# Patient Record
Sex: Male | Born: 1980 | Race: White | Hispanic: No | Marital: Married | Smoking: Never smoker
Health system: Southern US, Community
[De-identification: ages and names within clinical notes are randomized; demographics above are authoritative.]

## PROBLEM LIST (undated history)

## (undated) DIAGNOSIS — G4700 Insomnia, unspecified: Secondary | ICD-10-CM

## (undated) DIAGNOSIS — IMO0002 Reserved for concepts with insufficient information to code with codable children: Secondary | ICD-10-CM

## (undated) HISTORY — PX: FRACTURE SURGERY: SHX138

## (undated) HISTORY — PX: CHOLECYSTECTOMY: SHX55

---

## 2012-12-21 ENCOUNTER — Emergency Department (HOSPITAL_COMMUNITY)
Admission: EM | Admit: 2012-12-21 | Discharge: 2012-12-21 | Disposition: A | Discharge status: Home / Self Care | Payer: Managed Care, Other (non HMO) | Attending: Emergency Medicine | Admitting: Emergency Medicine

## 2012-12-21 ENCOUNTER — Emergency Department (HOSPITAL_COMMUNITY): Payer: Managed Care, Other (non HMO)

## 2012-12-21 ENCOUNTER — Encounter (HOSPITAL_COMMUNITY): Payer: Self-pay | Admitting: *Deleted

## 2012-12-21 DIAGNOSIS — Y929 Unspecified place or not applicable: Secondary | ICD-10-CM | POA: Insufficient documentation

## 2012-12-21 DIAGNOSIS — X500XXA Overexertion from strenuous movement or load, initial encounter: Secondary | ICD-10-CM | POA: Insufficient documentation

## 2012-12-21 DIAGNOSIS — G4700 Insomnia, unspecified: Secondary | ICD-10-CM | POA: Insufficient documentation

## 2012-12-21 DIAGNOSIS — Y9389 Activity, other specified: Secondary | ICD-10-CM | POA: Insufficient documentation

## 2012-12-21 DIAGNOSIS — S63502A Unspecified sprain of left wrist, initial encounter: Secondary | ICD-10-CM

## 2012-12-21 DIAGNOSIS — R0982 Postnasal drip: Secondary | ICD-10-CM | POA: Insufficient documentation

## 2012-12-21 DIAGNOSIS — R05 Cough: Secondary | ICD-10-CM | POA: Insufficient documentation

## 2012-12-21 DIAGNOSIS — S63509A Unspecified sprain of unspecified wrist, initial encounter: Secondary | ICD-10-CM | POA: Insufficient documentation

## 2012-12-21 DIAGNOSIS — R059 Cough, unspecified: Secondary | ICD-10-CM | POA: Insufficient documentation

## 2012-12-21 DIAGNOSIS — J3489 Other specified disorders of nose and nasal sinuses: Secondary | ICD-10-CM | POA: Insufficient documentation

## 2012-12-21 DIAGNOSIS — Z79899 Other long term (current) drug therapy: Secondary | ICD-10-CM | POA: Insufficient documentation

## 2012-12-21 DIAGNOSIS — Z8739 Personal history of other diseases of the musculoskeletal system and connective tissue: Secondary | ICD-10-CM | POA: Insufficient documentation

## 2012-12-21 HISTORY — DX: Reserved for concepts with insufficient information to code with codable children: IMO0002

## 2012-12-21 HISTORY — DX: Insomnia, unspecified: G47.00

## 2012-12-21 MED ORDER — IBUPROFEN 800 MG PO TABS: 800.0000 mg | ORAL_TABLET | Freq: Three times a day (TID) | ORAL | Status: DC

## 2012-12-21 MED ORDER — HYDROCODONE-ACETAMINOPHEN 5-325 MG PO TABS
1.0000 | ORAL_TABLET | Freq: Once | ORAL | Status: AC
  Administered 2012-12-21: 1 via ORAL
  Filled 2012-12-21: qty 1

## 2012-12-21 MED ORDER — BENZONATATE 100 MG PO CAPS: 200.0000 mg | ORAL_CAPSULE | Freq: Three times a day (TID) | ORAL | Status: DC | PRN

## 2012-12-21 MED ORDER — HYDROCODONE-ACETAMINOPHEN 5-325 MG PO TABS
ORAL_TABLET | ORAL | Status: DC
Start: 2012-12-21 — End: 2014-11-17

## 2012-12-21 MED ORDER — IBUPROFEN 800 MG PO TABS
800.0000 mg | ORAL_TABLET | Freq: Once | ORAL | Status: AC
  Administered 2012-12-21: 800 mg via ORAL
  Filled 2012-12-21: qty 1

## 2012-12-21 NOTE — ED Notes (Signed)
Pt moved a box and states his left wrist snapped. Pt also states he has a dry cough.

## 2012-12-21 NOTE — ED Provider Notes (Signed)
CSN: [id number]     Arrival date & time 12/21/12  1942 History   First MD Initiated Contact with Patient 12/21/12 1953     Chief Complaint  Patient presents with  . Wrist Pain   (Consider location/radiation/quality/duration/timing/severity/associated sxs/prior Treatment) HPI Comments: Jordan Molina is a 32 y.o. male who presents to the Emergency Department complaining of left wrist pain or can earlier today while picking up boxes. States he felt his left wrist" pop". He states he has pain with movement of his wrist that radiates up his left forearm and into his left fifth finger. He denies swelling or numbness. Patient also complains of a dry cough for several days. He does also report some nasal congestion. He denies fever, shortness of breath, chest pain, sore throat or other URI symptoms.  Patient is a 32 y.o. male presenting with wrist pain. The history is provided by the patient.  Wrist Pain This is a new problem. The current episode started today. The problem occurs constantly. The problem has been unchanged. Associated symptoms include arthralgias, congestion and coughing. Pertinent negatives include no abdominal pain, chest pain, chills, diaphoresis, fever, headaches, joint swelling, nausea, neck pain, numbness, rash, sore throat, swollen glands or weakness. The symptoms are aggravated by bending. He has tried nothing for the symptoms. The treatment provided no relief.    Past Medical History  Diagnosis Date  . Insomnia   . Degenerative disc disease    Past Surgical History  Procedure Laterality Date  . Cholecystectomy    . Fracture surgery      facial   History reviewed. No pertinent family history. History  Substance Use Topics  . Smoking status: Never Smoker   . Smokeless tobacco: Not on file  . Alcohol Use: No    Review of Systems  Constitutional: Negative for fever, chills and diaphoresis.  HENT: Positive for congestion and postnasal drip. Negative for sore throat,  sneezing, trouble swallowing and neck pain.   Respiratory: Positive for cough. Negative for chest tightness.   Cardiovascular: Negative for chest pain.  Gastrointestinal: Negative for nausea and abdominal pain.  Genitourinary: Negative for dysuria and difficulty urinating.  Musculoskeletal: Positive for arthralgias. Negative for joint swelling.  Skin: Negative for color change, rash and wound.  Neurological: Negative for dizziness, weakness, numbness and headaches.  All other systems reviewed and are negative.    Allergies  Review of patient's allergies indicates no known allergies.  Home Medications   Current Outpatient Rx  Name  Route  Sig  Dispense  Refill  . ALPRAZolam (XANAX) 1 MG tablet   Oral   Take 1 mg by mouth 3 (three) times daily.         . carisoprodol (SOMA) 350 MG tablet   Oral   Take 350 mg by mouth 3 (three) times daily as needed for muscle spasms.         Marland Kitchen lidocaine (LIDODERM) 5 %   Transdermal   Place 1 patch onto the skin daily. Remove & Discard patch within 12 hours or as directed by MD         . traMADol (ULTRAM) 50 MG tablet   Oral   Take 50 mg by mouth every 6 (six) hours as needed for pain.         Marland Kitchen zolpidem (AMBIEN) 10 MG tablet   Oral   Take 10 mg by mouth at bedtime as needed for sleep.          BP 124/69  Pulse  90  Temp(Src) 97.8 F (36.6 C) (Oral)  Resp 20  Ht 5\' 11"  (1.803 m)  Wt 135 lb (61.236 kg)  BMI 18.84 kg/m2  SpO2 100% Physical Exam  Nursing note and vitals reviewed. Constitutional: He is oriented to person, place, and time. He appears well-developed and well-nourished. No distress.  HENT:  Head: Normocephalic and atraumatic.  Mouth/Throat: Oropharynx is clear and moist.  Neck: Normal range of motion. Neck supple.  Cardiovascular: Normal rate, regular rhythm, normal heart sounds and intact distal pulses.   No murmur heard. Pulmonary/Chest: Effort normal and breath sounds normal. No respiratory distress. He has  no wheezes. He has no rales. He exhibits no tenderness.  Musculoskeletal: He exhibits tenderness. He exhibits no edema.       Left wrist: He exhibits tenderness. He exhibits normal range of motion, no bony tenderness, no swelling, no effusion, no crepitus, no deformity and no laceration.       Arms: Localized tenderness to the ulnar aspect of the left wrist.  Radial pulse is brisk, distal sensation intact.  CR< 2 sec.  No bruising , edema or bony deformity.  Pain to left wrist is reproduced with palmar or dorsal flexion. Compartments soft.  Lymphadenopathy:    He has no cervical adenopathy.  Neurological: He is alert and oriented to person, place, and time. He exhibits normal muscle tone. Coordination normal.  Skin: Skin is warm and dry.    ED Course  Procedures (including critical care time) Labs Review Labs Reviewed - No data to display Imaging Review Dg Wrist Complete Left  12/21/2012   *RADIOLOGY REPORT*  Clinical Data: Pain and limited range of motion  LEFT WRIST - COMPLETE 3+ VIEW  Comparison: None.  Findings: Frontal, oblique, lateral, and ulnar deviation scaphoid images were obtained.  There is no fracture or dislocation. Joint spaces appear intact.  No erosive change.  IMPRESSION: No abnormality noted.   Original Report Authenticated By: Bretta Bang, M.D.    MDM    X-ray results reviewed and discussed with the patient. He has tenderness to palpation along the ulnar aspect of the left wrist. Pain is reproduced with dorsal or palmar flexion. No erythema, edema of the wrist or forearm. Likely sprain.  Velcro wrist splint applied, pain improved, remains neurovascularly intact.  Patient agrees to RICE therapy and close orthopedic followup if his symptoms aren't improving. Vital signs are stable he appears stable for discharge   Wallie Lagrand L. Trisha Mangle, PA-C 12/21/12 2058

## 2012-12-22 NOTE — ED Provider Notes (Signed)
Medical screening examination/treatment/procedure(s) were performed by non-physician practitioner and as supervising physician I was immediately available for consultation/collaboration.    Christopher J. Pollina, MD 12/22/12 1521 

## 2014-11-17 ENCOUNTER — Emergency Department (HOSPITAL_COMMUNITY): Payer: Managed Care, Other (non HMO)

## 2014-11-17 ENCOUNTER — Emergency Department (HOSPITAL_COMMUNITY)
Admission: EM | Admit: 2014-11-17 | Discharge: 2014-11-17 | Disposition: A | Discharge status: Home / Self Care | Payer: Managed Care, Other (non HMO) | Attending: Emergency Medicine | Admitting: Emergency Medicine

## 2014-11-17 ENCOUNTER — Encounter (HOSPITAL_COMMUNITY): Payer: Self-pay | Admitting: Emergency Medicine

## 2014-11-17 DIAGNOSIS — Y998 Other external cause status: Secondary | ICD-10-CM | POA: Insufficient documentation

## 2014-11-17 DIAGNOSIS — S00402A Unspecified superficial injury of left ear, initial encounter: Secondary | ICD-10-CM | POA: Insufficient documentation

## 2014-11-17 DIAGNOSIS — S060X0A Concussion without loss of consciousness, initial encounter: Secondary | ICD-10-CM | POA: Insufficient documentation

## 2014-11-17 DIAGNOSIS — S0993XA Unspecified injury of face, initial encounter: Secondary | ICD-10-CM | POA: Diagnosis not present

## 2014-11-17 DIAGNOSIS — Y9389 Activity, other specified: Secondary | ICD-10-CM | POA: Diagnosis not present

## 2014-11-17 DIAGNOSIS — Z8669 Personal history of other diseases of the nervous system and sense organs: Secondary | ICD-10-CM | POA: Insufficient documentation

## 2014-11-17 DIAGNOSIS — Y9289 Other specified places as the place of occurrence of the external cause: Secondary | ICD-10-CM | POA: Insufficient documentation

## 2014-11-17 DIAGNOSIS — F0781 Postconcussional syndrome: Secondary | ICD-10-CM

## 2014-11-17 DIAGNOSIS — W228XXA Striking against or struck by other objects, initial encounter: Secondary | ICD-10-CM | POA: Diagnosis not present

## 2014-11-17 DIAGNOSIS — M26629 Arthralgia of temporomandibular joint, unspecified side: Secondary | ICD-10-CM

## 2014-11-17 MED ORDER — HYDROCODONE-ACETAMINOPHEN 5-325 MG PO TABS: 1.0000 | ORAL_TABLET | ORAL | Status: AC | PRN

## 2014-11-17 MED ORDER — KETOROLAC TROMETHAMINE 60 MG/2ML IM SOLN
60.0000 mg | Freq: Once | INTRAMUSCULAR | Status: AC
  Administered 2014-11-17: 60 mg via INTRAMUSCULAR
  Filled 2014-11-17: qty 2

## 2014-11-17 MED ORDER — IBUPROFEN 600 MG PO TABS: 600.0000 mg | ORAL_TABLET | Freq: Four times a day (QID) | ORAL | Status: AC | PRN

## 2014-11-17 MED ORDER — HYDROCODONE-ACETAMINOPHEN 5-325 MG PO TABS
1.0000 | ORAL_TABLET | Freq: Once | ORAL | Status: AC
  Administered 2014-11-17: 1 via ORAL
  Filled 2014-11-17: qty 1

## 2014-11-17 NOTE — ED Notes (Signed)
Pt given discharge instructions- Verbalized understanding , Also discussed other non medication pain interventions - Stated that he was instructed by ER provider to fu with ENT md by end of next week-  Abulated off unit - Work note provided

## 2014-11-17 NOTE — ED Provider Notes (Signed)
CSN: [id number]     Arrival date & time 11/17/14  1538 History   First MD Initiated Contact with Patient 11/17/14 1615     Chief Complaint  Patient presents with  . Head Injury    2 days ago     (Consider location/radiation/quality/duration/timing/severity/associated sxs/prior Treatment) The history is provided by the patient.   Jordan Molina is a 34 y.o. male with pain along his left temple, ear and jawline since he sustained a blow to the head 2 days ago.  He describes going down a flight of steps which has a narrow wooden overhand and he did not duck in time, hitting his left head against the edge of a wooden piece of plywood.  He denies loc at the time, but endorses persistent generalized headache along with decreased ability to concentrate and has been sleeping more since the injury occurred.  He also reports left ear pain and a "popping" sensation with pain in his left jaw with movement.  He feels like his jaw will lock if opens too far but has not.  He denies any dental injury and with his mouth closed, his bite feels normal.  He denies weakness, numbness, nausea or vomiting.  He has taken ibuprofen without relief and has applied ice packs.    Past Medical History  Diagnosis Date  . Insomnia   . Degenerative disc disease    Past Surgical History  Procedure Laterality Date  . Cholecystectomy    . Fracture surgery      facial   History reviewed. No pertinent family history. History  Substance Use Topics  . Smoking status: Never Smoker   . Smokeless tobacco: Not on file  . Alcohol Use: No    Review of Systems  Constitutional: Negative for fever.  HENT: Negative for congestion, dental problem, facial swelling, sore throat, tinnitus and trouble swallowing.   Eyes: Negative.  Negative for visual disturbance.  Respiratory: Negative for chest tightness and shortness of breath.   Cardiovascular: Negative for chest pain.  Gastrointestinal: Negative for nausea and abdominal  pain.  Genitourinary: Negative.   Musculoskeletal: Negative for joint swelling, arthralgias and neck stiffness.  Skin: Negative.  Negative for rash and wound.  Neurological: Positive for headaches. Negative for dizziness, weakness, light-headedness and numbness.  Psychiatric/Behavioral: Negative.       Allergies  Review of patient's allergies indicates no known allergies.  Home Medications   Prior to Admission medications   Medication Sig Start Date End Date Taking? Authorizing Provider  HYDROcodone-acetaminophen (NORCO/VICODIN) 5-325 MG per tablet Take 1 tablet by mouth every 4 (four) hours as needed. 11/17/14   Burgess Amor, PA-C  ibuprofen (ADVIL,MOTRIN) 600 MG tablet Take 1 tablet (600 mg total) by mouth every 6 (six) hours as needed. 11/17/14   Burgess Amor, PA-C   BP 129/75 mmHg  Pulse 71  Resp 15  Ht  (1.803 m)  Wt 140 lb (63.504 kg)  BMI 19.53 kg/m2  SpO2 100% Physical Exam  Constitutional: He is oriented to person, place, and time. He appears well-developed and well-nourished. No distress.  HENT:  Head: Normocephalic and atraumatic. Head is without raccoon's eyes, without Battle's sign and without contusion.  Right Ear: External ear and ear canal normal.  Left Ear: Ear canal normal. There is mastoid tenderness.  Nose: Nose normal.  Mouth/Throat: Uvula is midline. No trismus in the jaw.  No visual signs of facial or head trauma.  He has ttp left temple and left mastoid, left TM joint.  There is moderate crepitus with extension of the TM joint.  No palpable deformity, no edema.  Dentition intact.    Eyes: Conjunctivae are normal.  Neck: Normal range of motion. Spinous process tenderness and muscular tenderness present.  Cardiovascular: Normal rate, regular rhythm, normal heart sounds and intact distal pulses.   Pulmonary/Chest: Effort normal and breath sounds normal. He has no wheezes.  Abdominal: Soft. Bowel sounds are normal. There is no tenderness.  Musculoskeletal:  Normal range of motion.  Neurological: He is alert and oriented to person, place, and time. He has normal strength. No cranial nerve deficit or sensory deficit. He exhibits normal muscle tone. Gait normal. GCS eye subscore is 4. GCS verbal subscore is 5. GCS motor subscore is 6.  Skin: Skin is warm and dry.  Psychiatric: He has a normal mood and affect.  Nursing note and vitals reviewed.   ED Course  Procedures (including critical care time) Labs Review Labs Reviewed - No data to display  Imaging Review Dg Cervical Spine Complete  11/17/2014   CLINICAL DATA:  Head trauma, pt hit left side of temple area on metal awning at door  EXAM: CERVICAL SPINE  4+ VIEWS  COMPARISON:  None.  FINDINGS: No fracture. No spondylolisthesis. Disc spaces are well maintained. No significant neural foraminal. Changes from the operative reduction mandibular fractures, now well healed.  Soft tissues are unremarkable.  IMPRESSION: No fracture or spondylolisthesis. Normal appearance of cervical spine.   Electronically Signed   By: Amie Portland M.D.   On: 11/17/2014 18:08   Ct Head Wo Contrast  11/17/2014   CLINICAL DATA:  Patient hit head falling down the stairs. Headache. Neck discomfort. Jaw pain. Initial encounter.  EXAM: CT HEAD WITHOUT CONTRAST  CT MAXILLOFACIAL WITHOUT CONTRAST  TECHNIQUE: Multidetector CT imaging of the head and maxillofacial structures were performed using the standard protocol without intravenous contrast. Multiplanar CT image reconstructions of the maxillofacial structures were also generated.  COMPARISON:  None.  FINDINGS: CT HEAD FINDINGS  Ventricles and sulci are appropriate for patient's age. No evidence for acute cortically based infarct, intracranial hemorrhage, mass lesion or mass-effect. Polypoid mucosal thickening right maxillary sinus. Calvarium is intact.  CT MAXILLOFACIAL FINDINGS  Orbits are unremarkable. Facial soft tissues are grossly unremarkable. Zygomatic arches are intact.  Pterygoid plates are intact. Postsurgical changes to the maxilla. Maxilla is intact. Mandible is intact. Nasal bones intact. Orbital walls are intact. TMJ joint degenerative changes bilaterally.  IMPRESSION: No acute intracranial process.  No acute maxillofacial fracture.  Bilateral TMJ joint degenerative change.   Electronically Signed   By: Annia Belt M.D.   On: 11/17/2014 18:34   Ct Maxillofacial Wo Cm  11/17/2014   CLINICAL DATA:  Patient hit head falling down the stairs. Headache. Neck discomfort. Jaw pain. Initial encounter.  EXAM: CT HEAD WITHOUT CONTRAST  CT MAXILLOFACIAL WITHOUT CONTRAST  TECHNIQUE: Multidetector CT imaging of the head and maxillofacial structures were performed using the standard protocol without intravenous contrast. Multiplanar CT image reconstructions of the maxillofacial structures were also generated.  COMPARISON:  None.  FINDINGS: CT HEAD FINDINGS  Ventricles and sulci are appropriate for patient's age. No evidence for acute cortically based infarct, intracranial hemorrhage, mass lesion or mass-effect. Polypoid mucosal thickening right maxillary sinus. Calvarium is intact.  CT MAXILLOFACIAL FINDINGS  Orbits are unremarkable. Facial soft tissues are grossly unremarkable. Zygomatic arches are intact. Pterygoid plates are intact. Postsurgical changes to the maxilla. Maxilla is intact. Mandible is intact. Nasal bones intact. Orbital walls  are intact. TMJ joint degenerative changes bilaterally.  IMPRESSION: No acute intracranial process.  No acute maxillofacial fracture.  Bilateral TMJ joint degenerative change.   Electronically Signed   By: Annia Belt M.D.   On: 11/17/2014 18:34     EKG Interpretation None      MDM   Final diagnoses:  Post concussion syndrome  Temporomandibular joint (TMJ) pain    Patients labs and/or radiological studies were reviewed and considered during the medical decision making and disposition process.   Imaging was reviewed, interpreted and I  agree with radiologists reading.  Results were also discussed with patient.  Pt was given post concussion instructions as I suspect he does have mild lingering concussion sx.  He was given ibuprofen, small quantity of hydrocodone, cautioned re sedation.  Referral to Dr Suszanne Conners for recheck if sx persist beyond the next week.  Ice tx through tomorrow, may add heat on Monday.  Soft food diet until sx improved.  The patient appears reasonably screened and/or stabilized for discharge and I doubt any other medical condition or other [facility] requiring further screening, evaluation, or treatment in the ED at this time prior to discharge.     Burgess Amor, PA-C 11/17/14 1941  Benjiman Core, MD 11/17/14 [id number]

## 2014-11-17 NOTE — Discharge Instructions (Signed)
Temporomandibular Problems  Temporomandibular joint (TMJ) dysfunction means there are problems with the joint between your jaw and your skull. This is a joint lined by cartilage like other joints in your body but also has a small disc in the joint which keeps the bones from rubbing on each other. These joints are like other joints and can get inflamed (sore) from arthritis and other problems. When this joint gets sore, it can cause headaches and pain in the jaw and the face. CAUSES  Usually the arthritic types of problems are caused by soreness in the joint. Soreness in the joint can also be caused by overuse. This may come from grinding your teeth. It may also come from mis-alignment in the joint. DIAGNOSIS Diagnosis of this condition can often be made by history and exam. Sometimes your caregiver may need X-rays or an MRI scan to determine the exact cause. It may be necessary to see your dentist to determine if your teeth and jaws are lined up correctly. TREATMENT  Most of the time this problem is not serious; however, sometimes it can persist (become chronic). When this happens medications that will cut down on inflammation (soreness) help. Sometimes a shot of cortisone into the joint will be helpful. If your teeth are not aligned it may help for your dentist to make a splint for your mouth that can help this problem. If no physical problems can be found, the problem may come from tension. If tension is found to be the cause, biofeedback or relaxation techniques may be helpful. HOME CARE INSTRUCTIONS   Later in the day, applications of ice packs may be helpful. Ice can be used in a plastic bag with a towel around it to prevent frostbite to skin. This may be used about every 2 hours for 20 to 30 minutes, as needed while awake, or as directed by your caregiver.  Only take over-the-counter or prescription medicines for pain, discomfort, or fever as directed by your caregiver.  If physical therapy was  prescribed, follow your caregiver's directions.  Wear mouth appliances as directed if they were given. Document Released: 12/23/2000 Document Revised: 06/22/2011 Document Reviewed: 04/01/2008 [facility] Patient Information 2015 [place], [place]. This information is not intended to replace advice given to you by your health care provider. Make sure you discuss any questions you have with your health care provider.  Post-Concussion Syndrome Post-concussion syndrome means you have problems after a head injury. The problems can last for weeks or months. The problems usually go away on their own over time. HOME CARE   Only take medicines as told by your doctor. Do not take aspirin.  Sleep with your head raised (elevated) to help with headaches.  Avoid activities that can cause another head injury.  Do not play contact sports like football, hockey, soccer or basketball. Do not do other risky activities like downhill skiing or martial arts, or ride horses until your doctor says it is OK.  Keep all doctor visits as told. GET HELP RIGHT AWAY IF:  You feel confused or very sleepy.  You cannot wake the injured person.  You feel sick to your stomach (nauseous) or keep throwing up (vomiting).  You feel like you are moving when you are not (vertigo).  You notice the injured person's eyes moving back and forth very fast.  You start shaking (convulsing) or pass out (faint).  You have very bad headaches that do not get better with medicine.  You cannot use your arms or legs normally.  The black centers of your eyes (pupils) change size.  You have clear or bloody fluid coming from your nose or ears.  Your problems get worse, not better. MAKE SURE YOU:  Understand these instructions.  Will watch your condition.  Will get help right away if you are not doing well or get worse. Document Released: 05/07/2004 Document Revised: 01/18/2013 Document Reviewed: 07/05/2013 [facility] Patient  Information 2015 [place], [place]. This information is not intended to replace advice given to you by your health care provider. Make sure you discuss any questions you have with your health care provider.   Your imaging studies today are negative for acute injury.  However I do suspect she may have a mild concussion as discussed.  Refer to the instructions above for treatment of this condition.  I suggest a follow-up with Dr. Suszanne Conners if your jaw pain persists beyond the next week.  Take medications as prescribed, do not drive within 4 hours can hydrocodone as this medication may make you drowsy.  You may continue use ice to your area of injury through tomorrow, I would add a heating pad 20 minutes several times daily starting on Monday.

## 2014-11-17 NOTE — ED Notes (Signed)
Hit head 2 days ago going down steps on plywood.  C/o headache, neck discomfort and jaw pain.  Jaw is making noise when opening and closing mouth.

## 2014-11-17 NOTE — ED Notes (Signed)
Pt requesting something else for pain -- enquired if steroid shot could be given in his jaw . Pt informed that this would not be appropriate - Discussed  Pt's request for pain med with PA and orders recieved for toradol .

## 2016-12-20 IMAGING — CT CT HEAD W/O CM
3 of 4 series · 16 of 47 positions shown, 19 images · non-contrast
Comparison: None.

CLINICAL DATA: Patient hit head falling down the stairs. Headache.
Neck discomfort. Jaw pain. Initial encounter.

EXAM:
CT HEAD WITHOUT CONTRAST
CT MAXILLOFACIAL WITHOUT CONTRAST
TECHNIQUE: Multidetector CT imaging of the head and maxillofacial structures
were performed using the standard protocol without intravenous
contrast. Multiplanar CT image reconstructions of the maxillofacial
structures were also generated.

[Series 4: max st 2.0 h31s · axial · 0.36mm/px · z∈[+32,+180]mm · 10 of 84 slices shown, 13 images]
[im 5/84  brain]
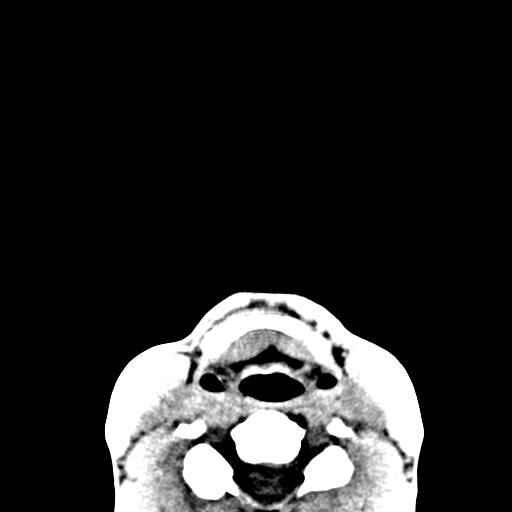
[im 5/84  bone]
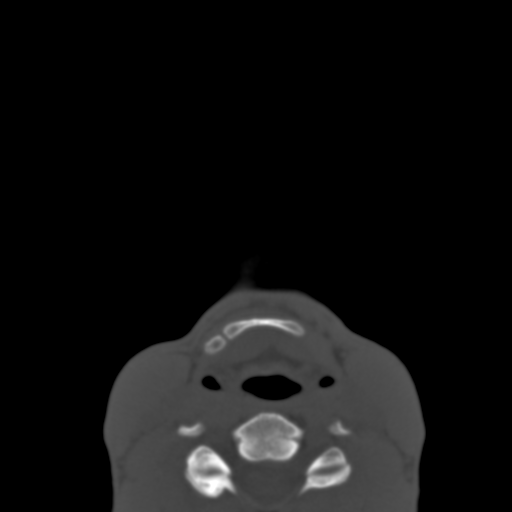
[im 13/84  brain]
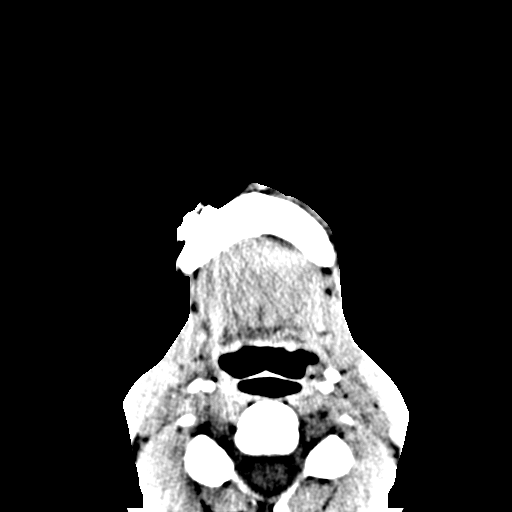
[im 21/84  brain]
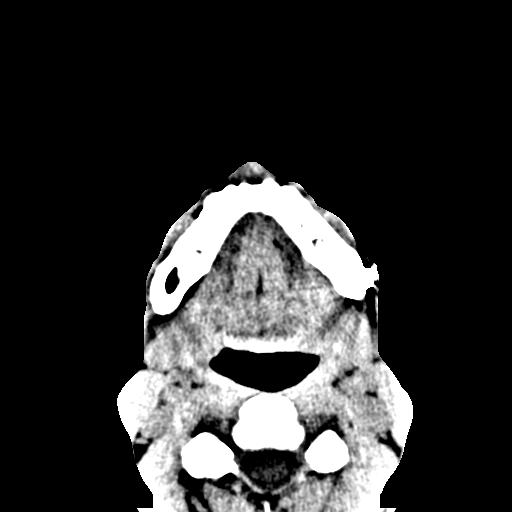
[im 30/84  brain]
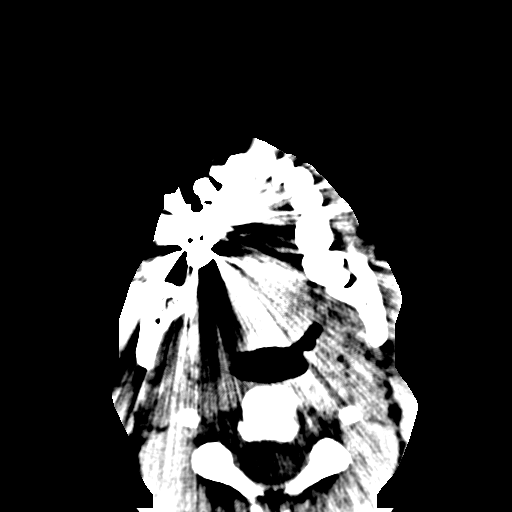
[im 38/84  brain]
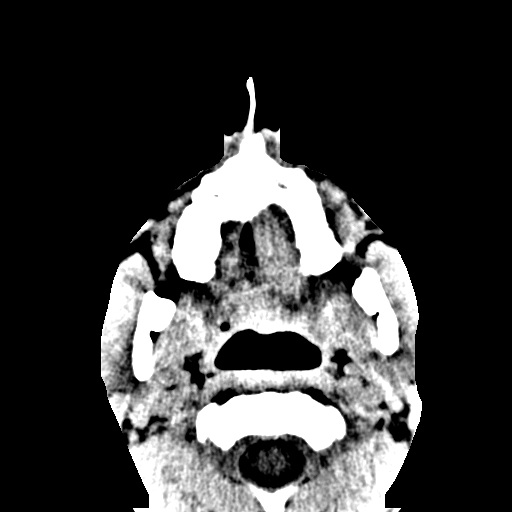
[im 38/84  bone]
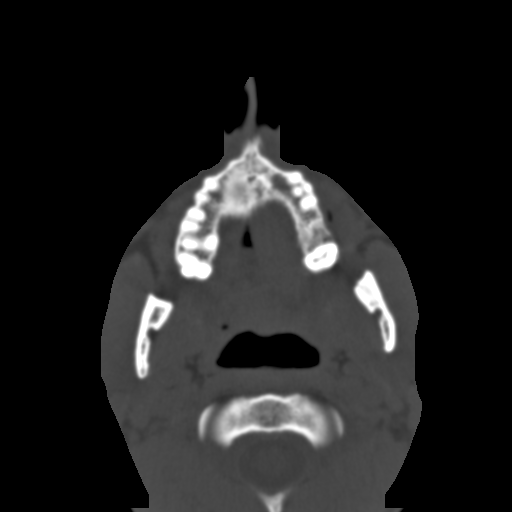
[im 46/84  brain]
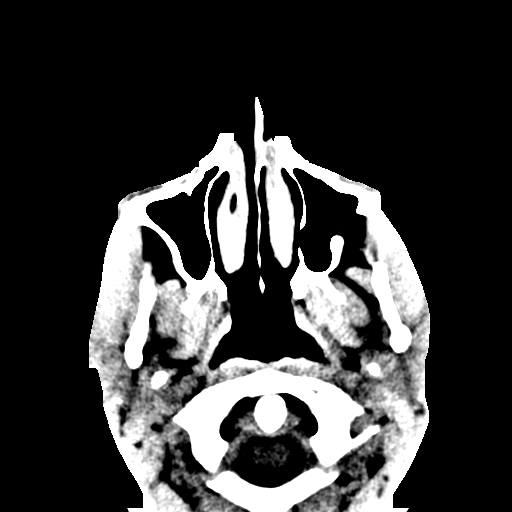
[im 54/84  brain]
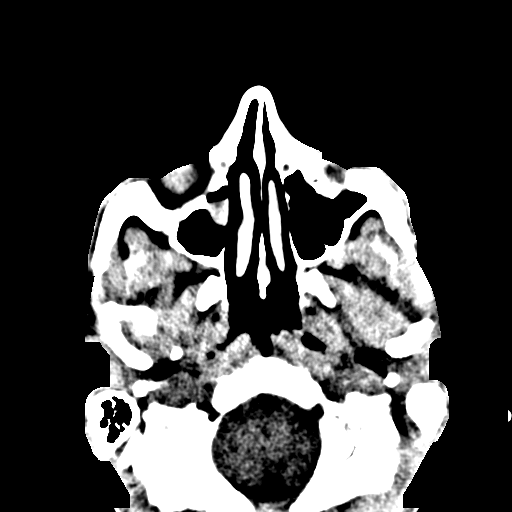
[im 63/84  brain]
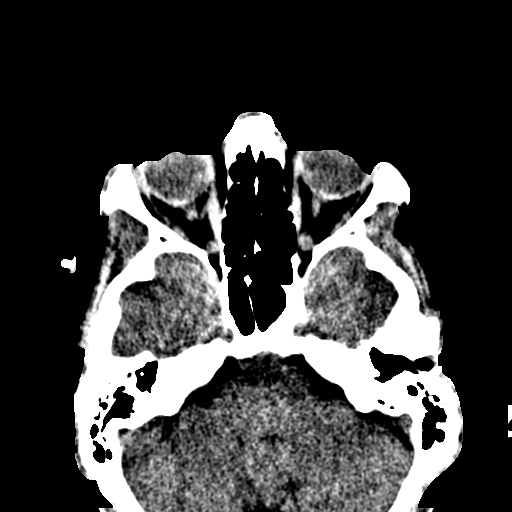
[im 71/84  brain]
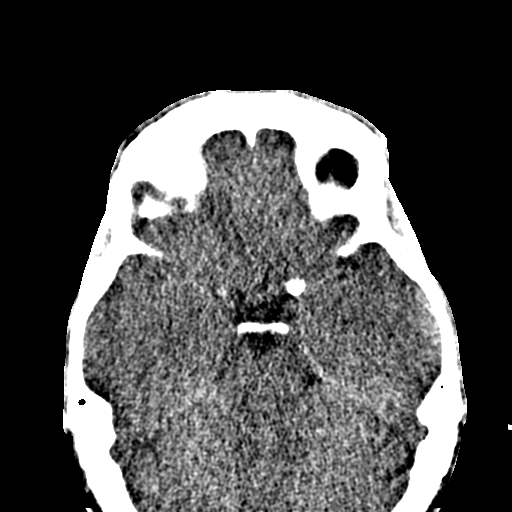
[im 71/84  bone]
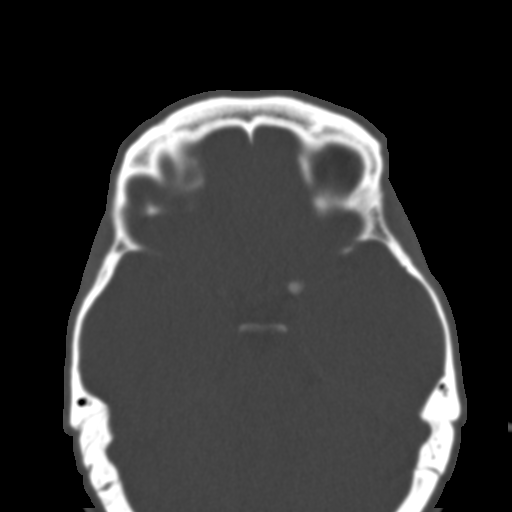
[im 79/84  brain]
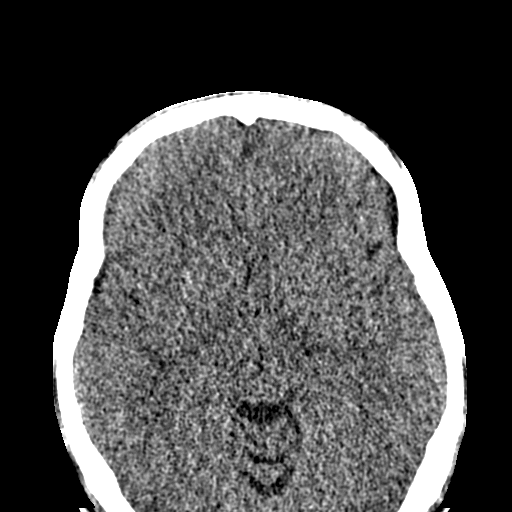

[Series 6: max st coronal · coronal · 0.33mm/px · 3 of 86 slices shown]
[im 29/86  brain]
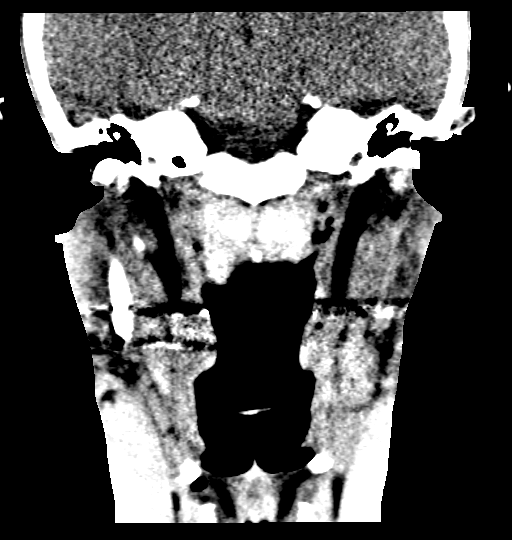
[im 38/86  brain]
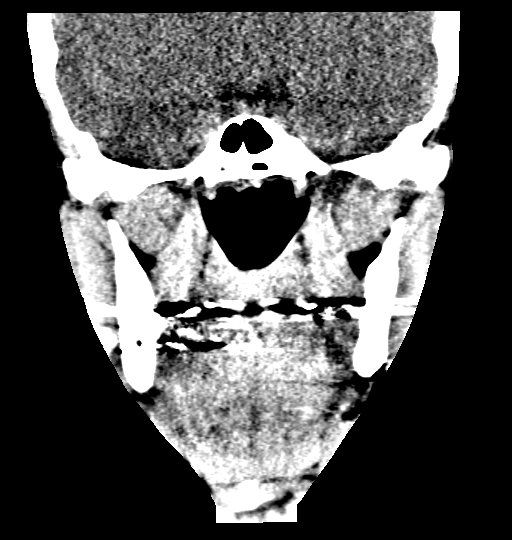
[im 48/86  brain]
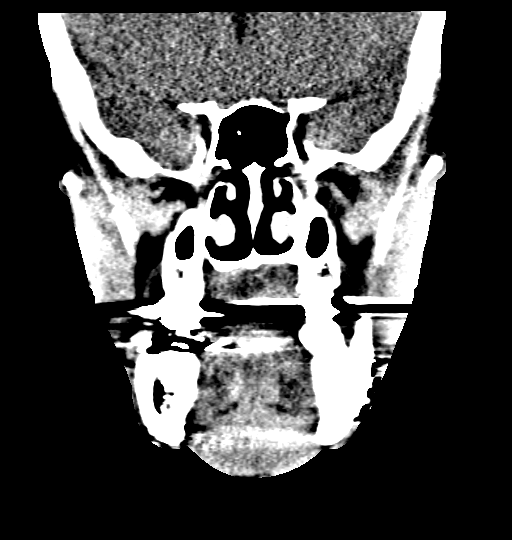

[Series 7: max st sag · sagittal · 0.33mm/px · 3 of 84 slices shown]
[im 28/84  brain]
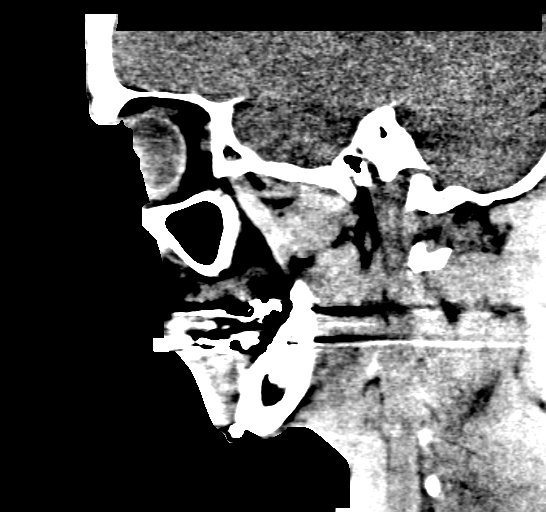
[im 42/84  brain]
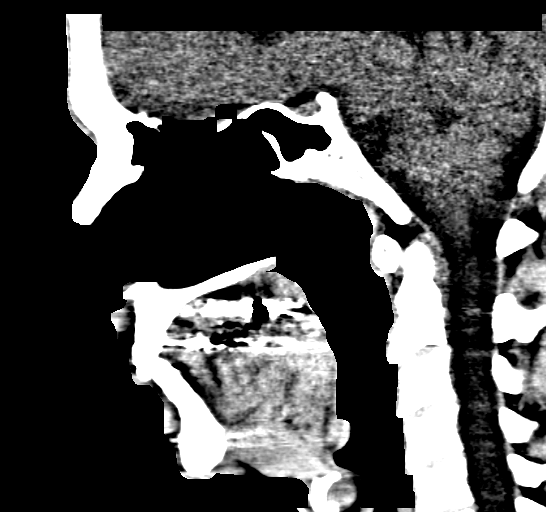
[im 56/84  brain]
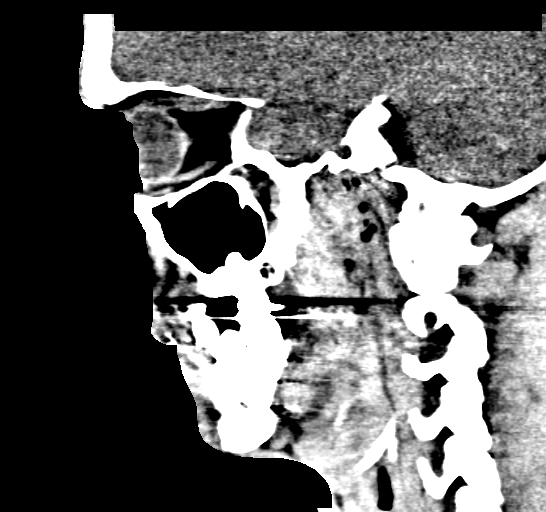

[16 of 47 positions shown; findings below may reference images not displayed]

FINDINGS: CT HEAD FINDINGS

Ventricles and sulci are appropriate for patient's age. No evidence
for acute cortically based infarct, intracranial hemorrhage, mass
lesion or mass-effect. Polypoid mucosal thickening right maxillary
sinus. Calvarium is intact.

CT MAXILLOFACIAL FINDINGS

Orbits are unremarkable. Facial soft tissues are grossly
unremarkable. Zygomatic arches are intact. Pterygoid plates are
intact. Postsurgical changes to the maxilla. Maxilla is intact.
Mandible is intact. Nasal bones intact. Orbital walls are intact.
TMJ joint degenerative changes bilaterally.
IMPRESSION: No acute intracranial process.

No acute maxillofacial fracture.

Bilateral TMJ joint degenerative change.
